# Patient Record
Sex: Female | Born: 1990 | Race: Black or African American | Hispanic: No | Marital: Single | Smoking: Current every day smoker
Health system: Southern US, Community
[De-identification: ages and names within clinical notes are randomized; demographics above are authoritative.]

---

## 2014-06-06 ENCOUNTER — Emergency Department
Admission: EM | Admit: 2014-06-06 | Discharge: 2014-06-06 | Disposition: A | Discharge status: Home / Self Care | Payer: Self-pay | Attending: Emergency Medicine | Admitting: Emergency Medicine

## 2014-06-06 ENCOUNTER — Other Ambulatory Visit: Payer: Self-pay

## 2014-06-06 ENCOUNTER — Emergency Department: Payer: Self-pay

## 2014-06-06 DIAGNOSIS — H1031 Unspecified acute conjunctivitis, right eye: Secondary | ICD-10-CM | POA: Insufficient documentation

## 2014-06-06 MED ORDER — NEOMYCIN-POLYMYXIN-DEXAMETH 3.5-10000-0.1 OP SUSP
OPHTHALMIC | 0 refills | Status: AC
Start: 2014-06-06 — End: ?
  Filled 2014-06-06: qty 5, 10d supply, fill #0

## 2014-06-06 MED ORDER — SULFACETAMIDE SODIUM 10 % OP SOLN
1.0000 [drp] | OPHTHALMIC | Status: AC
Start: 2014-06-06 — End: ?

## 2014-06-06 NOTE — Discharge Instructions (Signed)
Conjunctivitis    You were diagnosed with conjunctivitis. This is also called "pink eye".    Conjunctivitis is an inflammation of the conjunctiva. These are the thin coverings of the white part of the eye and insides of the eyelids. It is caused by many different things. This includes viruses and bacteria. It even includes chemicals. Particles of "junk" that irritate the eye can also be a cause. Viruses are the most common cause.    Symptoms of conjunctivitis include pink eye and redness and drainage. It may feel like there is something in your eye (foreign body sensation). Your lid may get swollen. Your eyelids may also mat or get stuck in the morning.    Conjunctivitis can be very contagious. This means it can easily spread to others. You SHOULD NOT share hygienic items. This includes towels, make-up and tissues. You SHOULD NOT share clothing items. Wash your hands several times a day. Avoid touching your eyes.    Bacterial conjunctivitis is treated with warm compresses. It is also treated with ophthalmic (eye) antibiotics. These antibiotics are topical (not swallowed). Medicine is generally used for 5-7 days.    You SHOULD NOT wear contact lenses while you have conjunctivitis. Wait 48 hours after the infection completely clears up before using them again.    There is no specific follow-up needed. However, if you get any of the problems listed below, you will need a follow-up. If you don t get better as expected, you will also need a follow-up. It is always a good idea to get rechecked by your eye doctor if possible.    YOU SHOULD SEEK MEDICAL ATTENTION IMMEDIATELY, EITHER HERE OR AT THE NEAREST EMERGENCY DEPARTMENT, IF ANY OF THE FOLLOWING OCCURS:   Increasing eye pain.   Vision problems (problems seeing).   Photophobia (light bothering your eyes).   You don t get better after a few days or symptoms get worse at any time.                Mary Rodgers  [id number]  [id number]  [phone]  06/06/2014    Discharge Instructions    As always, you are the most important factor in your recovery.  Please follow these instructions carefully.  If you have problems that we have not discussed, CALL OR VISIT YOUR DOCTOR RIGHT AWAY.     If you can't reach your doctor, return to the emergency department.    I Julieta Bellini understand the written and discussed instructions.  My questions have been answered.  I acknowledge receipt of these instructions.     Patient or responsible person:         Patient's Signature               Physician or Nurse

## 2014-06-06 NOTE — ED Provider Notes (Signed)
[facility] EMERGENCY DEPARTMENT APP H&P         CLINICAL SUMMARY          Diagnosis:    .     Final diagnoses:   Acute conjunctivitis of right eye, unspecified acute conjunctivitis         MDM Notes:      23 y/o F w/ R eye redness, itching and watering  Will tx abx drops for conjunctivitis  Ok for d/c home, ophtho f/u prn      Disposition:      Discharge          Discharge Medication List as of 06/06/2014  8:55 AM      START taking these medications    Details   sulfacetamide (BLEPH-10) 10 % ophthalmic solution Place 1 drop into the right eye every 3 (three) hours., Starting 06/06/2014, Until Discontinued, Print                       CLINICAL INFORMATION        HPI:      Chief Complaint: Eye Drainage  .      Context: home  Location: R eye  Duration: 1 day  Quality: itching  Timing: persistent  Maximum Severity: mild  Modifying Factors: none    Mary Rodgers is a 23 y.o. female  has no past medical history on file. who presents with R eye redness and itching w/ clear drainage for 1 day. Pt denies vision changes, HA, fever, facial pain. C/o URI sx x3 days w/ runny nose, ST and cough. No sick contacts. No other sx or complaints.      History obtained from: Patient      ROS:      Positive and negative ROS elements as per HPI.  All other systems reviewed and negative.      Physical Exam:      Pulse 97  BP 128/78 mmHg  Resp 18  SpO2 100 %  Temp      Physical Exam   Constitutional: She is oriented to person, place, and time. She appears well-developed and well-nourished. No distress.   HENT:   Head: Normocephalic and atraumatic.   Eyes: EOM and lids are normal. Pupils are equal, round, and reactive to light. Right eye exhibits no exudate. Right conjunctiva is injected.   Neck: Normal range of motion. Neck supple.   Cardiovascular: Normal rate, regular rhythm, normal heart sounds and intact distal pulses.    Pulmonary/Chest: Effort normal and breath sounds normal. No respiratory distress.   Abdominal: Soft.  Bowel sounds are normal. There is no tenderness.   Lymphadenopathy:     She has no cervical adenopathy.   Neurological: She is alert and oriented to person, place, and time.   Skin: Skin is warm and dry. She is not diaphoretic.   Nursing note and vitals reviewed.              PAST HISTORY        Primary Care Provider: No primary care provider on file.        PMH/PSH:    .     No past medical history on file.    She has no past surgical history on file.      Social/Family History:      She has no tobacco, alcohol, and drug history on file.    No family history on file.      Listed Medications  on Arrival:    .     Discharge Medication List as of 06/06/2014  8:55 AM         Allergies: She has No Known Allergies.            VISIT INFORMATION        Clinical Course in the ED:      Counseling: I have spoke with the patient/family and discussed today's findings, in addition to providing specific details for the plan of care. Questions are answered and there is agreement with the plan. Return precautions discussed.      Medications Given in the ED:    .     ED Medication Orders     None            Procedures:      Procedures      Interpretations:      Differential Diagnosis (not completely inclusive): High risk differentials considered, and include acute angle glaucoma, globe perforation, conjunctivitis, keratitis, episcleritis, iritis, corneal abrasion    EKG:           Rhythm Strip Interpretation / Cardiac Monitor Analysis:           Pulse Ox Analysis: saturation: 100 %; Oxygen use: room air; Interpretation: Normal      Critical Care Time:     Radiology -  interpreted by me with the following observations:     Splint -                   RESULTS        Lab Results:      Results     ** No results found for the last 24 hours. **              Radiology Results:      No orders to display               Scribe Attestation:                 Honor Junes, PA  06/06/14 1003

## 2014-06-06 NOTE — ED Provider Notes (Signed)
Date Time: 06/06/2014 8:57 AM  Patient Name: Mary Rodgers  Attending Physician: Judi Saa, MD  Attending Note:   The patient was seen and examined by the mid-level (physician's assistant or nurse practitioner), or fellow, and the plan of care was discussed with me. I agree with the plan as it was presented to me.         Judi Saa, MD  06/06/14 [phone]

## 2015-12-26 ENCOUNTER — Emergency Department (HOSPITAL_COMMUNITY): Payer: Medicaid Other

## 2015-12-26 ENCOUNTER — Encounter (HOSPITAL_COMMUNITY): Payer: Self-pay

## 2015-12-26 ENCOUNTER — Emergency Department (HOSPITAL_COMMUNITY)
Admission: EM | Admit: 2015-12-26 | Discharge: 2015-12-26 | Disposition: A | Discharge status: Home / Self Care | Payer: Medicaid Other | Attending: Emergency Medicine | Admitting: Emergency Medicine

## 2015-12-26 DIAGNOSIS — O99331 Smoking (tobacco) complicating pregnancy, first trimester: Secondary | ICD-10-CM | POA: Diagnosis not present

## 2015-12-26 DIAGNOSIS — F17210 Nicotine dependence, cigarettes, uncomplicated: Secondary | ICD-10-CM | POA: Insufficient documentation

## 2015-12-26 DIAGNOSIS — R1084 Generalized abdominal pain: Secondary | ICD-10-CM | POA: Insufficient documentation

## 2015-12-26 DIAGNOSIS — R102 Pelvic and perineal pain: Secondary | ICD-10-CM | POA: Diagnosis not present

## 2015-12-26 DIAGNOSIS — Z3A01 Less than 8 weeks gestation of pregnancy: Secondary | ICD-10-CM | POA: Insufficient documentation

## 2015-12-26 DIAGNOSIS — O9989 Other specified diseases and conditions complicating pregnancy, childbirth and the puerperium: Secondary | ICD-10-CM | POA: Insufficient documentation

## 2015-12-26 LAB — COMPREHENSIVE METABOLIC PANEL
ALK PHOS: 50 U/L (ref 38–126)
ALT: 12 U/L — ABNORMAL LOW (ref 14–54)
ANION GAP: 5 (ref 5–15)
AST: 16 U/L (ref 15–41)
Albumin: 3.4 g/dL — ABNORMAL LOW (ref 3.5–5.0)
BILIRUBIN TOTAL: 0.4 mg/dL (ref 0.3–1.2)
BUN: 7 mg/dL (ref 6–20)
CALCIUM: 8.9 mg/dL (ref 8.9–10.3)
CO2: 24 mmol/L (ref 22–32)
CREATININE: 0.53 mg/dL (ref 0.44–1.00)
Chloride: 108 mmol/L (ref 101–111)
Glucose, Bld: 91 mg/dL (ref 65–99)
Potassium: 3.5 mmol/L (ref 3.5–5.1)
SODIUM: 137 mmol/L (ref 135–145)
TOTAL PROTEIN: 6.7 g/dL (ref 6.5–8.1)

## 2015-12-26 LAB — URINALYSIS, ROUTINE W REFLEX MICROSCOPIC
Bilirubin Urine: NEGATIVE
Glucose, UA: NEGATIVE mg/dL
Hgb urine dipstick: NEGATIVE
Ketones, ur: NEGATIVE mg/dL
Leukocytes, UA: NEGATIVE
NITRITE: NEGATIVE
PROTEIN: NEGATIVE mg/dL
SPECIFIC GRAVITY, URINE: 1.026 (ref 1.005–1.030)
pH: 6.5 (ref 5.0–8.0)

## 2015-12-26 LAB — HCG, QUANTITATIVE, PREGNANCY: hCG, Beta Chain, Quant, S: 44011 m[IU]/mL — ABNORMAL HIGH (ref <5)

## 2015-12-26 LAB — I-STAT BETA HCG BLOOD, ED (MC, WL, AP ONLY)

## 2015-12-26 LAB — CBC
HCT: 33.9 % — ABNORMAL LOW (ref 36.0–46.0)
HEMOGLOBIN: 11.6 g/dL — AB (ref 12.0–15.0)
MCH: 31.1 pg (ref 26.0–34.0)
MCHC: 34.2 g/dL (ref 30.0–36.0)
MCV: 90.9 fL (ref 78.0–100.0)
PLATELETS: 285 10*3/uL (ref 150–400)
RBC: 3.73 MIL/uL — AB (ref 3.87–5.11)
RDW: 11.5 % (ref 11.5–15.5)
WBC: 6.9 10*3/uL (ref 4.0–10.5)

## 2015-12-26 LAB — LIPASE, BLOOD: LIPASE: 19 U/L (ref 11–51)

## 2015-12-26 MED ORDER — SODIUM CHLORIDE 0.9 % IV BOLUS (SEPSIS)
1000.0000 mL | Freq: Once | INTRAVENOUS | Status: DC
Start: 2015-12-26 — End: 2015-12-26

## 2015-12-26 NOTE — Discharge Instructions (Signed)
Continue taking prenatal vitamins.   Quit smoking.   [facility] for [facility] follow up as scheduled.   Return to ER if you have worse abdominal cramps, vaginal bleeding, fevers, vomiting.

## 2015-12-26 NOTE — ED Notes (Signed)
Pt reports [facility] for abd pain and was told she was 3-[redacted] weeks pregnant.  Pain has not improved.  [facility] [place] and made appt with [facility] for 02-01-16 for 1st appt.  G2P0.  Abd pain is mid lower abd.  No bleeding or urinary symptoms.

## 2015-12-26 NOTE — ED Provider Notes (Signed)
CSN: [id number]     Arrival date & time 12/26/15  1423 History   First MD [id number] Contact with Patient 12/26/15 1500     Chief Complaint  Patient presents with  . Abdominal Pain     (Consider location/radiation/quality/duration/timing/severity/associated sxs/prior Treatment) The history is provided by the patient.  Margaret Jacobs is a 25 y.o. female G2P0 here with lower abdominal pain. Lower abdominal pain for the last several weeks. [facility] and was seen in the ED on 5/10 and had positive pregnancy test. There was no ultrasound done at that time. [facility] but has persistent lower abdominal cramps. Denies any vaginal bleeding or discharge or any vomiting. Denies any Fevers or chills or urinary symptoms. She had 1 miscarriage previously.     History reviewed. No pertinent past medical history. History reviewed. No pertinent past surgical history. History reviewed. No pertinent family history. Social History  Substance Use Topics  . Smoking status: Current Every Day Smoker -- 0.50 packs/day    Types: Cigarettes  . Smokeless tobacco: None  . Alcohol Use: Yes   OB History    No data available     Review of Systems  Gastrointestinal: Positive for abdominal pain.  All other systems reviewed and are negative.     Allergies  Review of patient's allergies indicates no known allergies.  Home Medications   Prior to Admission medications   Not on File   BP 122/75 mmHg  Pulse 80  Temp(Src) 98.8 F (37.1 C) (Oral)  Resp 16  Ht 5\' 3"  (1.6 m)  Wt 196 lb (88.905 kg)  BMI 34.73 kg/m2  SpO2 100%  LMP  (LMP Unknown) Physical Exam  Constitutional: She is oriented to person, place, and time. She appears well-developed and well-nourished.  HENT:  Head: Normocephalic.  Mouth/Throat: Oropharynx is clear and moist.  Eyes: Conjunctivae are normal. Pupils are equal, round, and reactive to light.  Neck: Normal  range of motion. Neck supple.  Cardiovascular: Normal rate, regular rhythm and normal heart sounds.   Pulmonary/Chest: Effort normal and breath sounds normal. No respiratory distress. She has no wheezes. She has no rales.  Abdominal: Soft. Bowel sounds are normal.  + mild diffuse lower abdominal tenderness, no obvious adnexal tenderness, no CVAT   Musculoskeletal: Normal range of motion. She exhibits no edema or tenderness.  Neurological: She is alert and oriented to person, place, and time.  Skin: Skin is warm and dry.  Psychiatric: She has a normal mood and affect. Her behavior is normal. Judgment and thought content normal.  Nursing note and vitals reviewed.   ED Course  Procedures (including critical care time) Labs Review Labs Reviewed  COMPREHENSIVE METABOLIC PANEL - Abnormal; Notable for the following:    Albumin 3.4 (*)    ALT 12 (*)    All other components within normal limits  CBC - Abnormal; Notable for the following:    RBC 3.73 (*)    Hemoglobin 11.6 (*)    HCT 33.9 (*)    All other components within normal limits  HCG, QUANTITATIVE, PREGNANCY - Abnormal; Notable for the following:    hCG, Beta Chain, Quant, S 44011 (*)    All other components within normal limits  I-STAT BETA HCG BLOOD, ED ([facility], [facility], [facility] ONLY) - Abnormal; Notable for the following:    I-stat hCG, quantitative >2000.0 (*)    All other components within normal limits  LIPASE, BLOOD  URINALYSIS,  ROUTINE W REFLEX MICROSCOPIC (NOT AT [facility])    Imaging Review US Ob Comp Less 14 Wks  12/26/2015  CLINICAL DATA:  Abdominal pain. Evaluate for ectopic. Beta HCG greater than 2000. EXAM: OBSTETRIC <14 WK [place] AND TRANSVAGINAL OB US TECHNIQUE: Both transabdominal and transvaginal ultrasound examinations were performed for complete evaluation of the gestation as well as the maternal uterus, adnexal regions, and pelvic cul-de-sac. Transvaginal technique was performed to assess early pregnancy. COMPARISON:  None.  FINDINGS: Intrauterine gestational sac: Present Yolk sac:  Present Embryo:  Present Cardiac Activity: Present Heart Rate: 121  bpm MSD:   mm    w     d CRL:  7  mm   6 w   4 d                  [place] EDC: 08/16/2016 Subchorionic hemorrhage:  None demonstrated. Maternal uterus/adnexae: Hypoechoic area in the left ovary measuring 1.8 x 1.4 cm, probably corpus luteum. No mass or free fluid within the adjacent left adnexal region. Right ovary appears normal and there is no mass or free fluid demonstrated in the right adnexal region. No mass or free fluid seen within the cul-de-sac. IMPRESSION: 1. Single live intrauterine pregnancy with estimated gestational age of [redacted] weeks and 4 days. Fetal heart rate measured at 121 beats per minute. 2. Both maternal ovaries appear normal and there is no mass or free fluid seen within either adnexal region. Electronically Signed   By: Bary Richard M.D.   On: 12/26/2015 16:39   US Ob Transvaginal  12/26/2015  CLINICAL DATA:  Abdominal pain. Evaluate for ectopic. Beta HCG greater than 2000. EXAM: OBSTETRIC <14 WK [place] AND TRANSVAGINAL OB US TECHNIQUE: Both transabdominal and transvaginal ultrasound examinations were performed for complete evaluation of the gestation as well as the maternal uterus, adnexal regions, and pelvic cul-de-sac. Transvaginal technique was performed to assess early pregnancy. COMPARISON:  None. FINDINGS: Intrauterine gestational sac: Present Yolk sac:  Present Embryo:  Present Cardiac Activity: Present Heart Rate: 121  bpm MSD:   mm    w     d CRL:  7  mm   6 w   4 d                  [place] EDC: 08/16/2016 Subchorionic hemorrhage:  None demonstrated. Maternal uterus/adnexae: Hypoechoic area in the left ovary measuring 1.8 x 1.4 cm, probably corpus luteum. No mass or free fluid within the adjacent left adnexal region. Right ovary appears normal and there is no mass or free fluid demonstrated in the right adnexal region. No mass or free fluid seen within the cul-de-sac.  IMPRESSION: 1. Single live intrauterine pregnancy with estimated gestational age of [redacted] weeks and 4 days. Fetal heart rate measured at 121 beats per minute. 2. Both maternal ovaries appear normal and there is no mass or free fluid seen within either adnexal region. Electronically Signed   By: Bary Richard M.D.   On: 12/26/2015 16:39   I have personally reviewed and evaluated these images and lab results as part of my medical decision-making.   EKG Interpretation None      MDM   Final diagnoses:  Pelvic pain in pregnant patient at less than [redacted] weeks gestation    Brazil Voytko is a 25 y.o. female here with lower abdominal pain. About [redacted] weeks pregnant by dates. No vaginal discharge or vaginal bleeding. Haven't had documented IUP yet. Will get HCG level, transvag US.  5:11 PM UA nl. HCG 44011. US showed live IUP 6 weeks 4 days with FHR 121. FHR slightly lower than I expect but still within normal limits. She is already on prenatal vitamins and is trying to quit smoking. She [facility]. Return if she has severe cramps, vaginal bleeding.    Richardean Canal, MD 12/26/15 [phone]

## 2015-12-26 NOTE — ED Notes (Signed)
Pt transported to US

## 2016-01-15 ENCOUNTER — Emergency Department (HOSPITAL_COMMUNITY)
Admission: EM | Admit: 2016-01-15 | Discharge: 2016-01-15 | Disposition: A | Discharge status: Home / Self Care | Payer: Medicaid Other | Attending: Emergency Medicine | Admitting: Emergency Medicine

## 2016-01-15 ENCOUNTER — Encounter (HOSPITAL_COMMUNITY): Payer: Self-pay

## 2016-01-15 DIAGNOSIS — H9202 Otalgia, left ear: Secondary | ICD-10-CM

## 2016-01-15 DIAGNOSIS — O26891 Other specified pregnancy related conditions, first trimester: Secondary | ICD-10-CM | POA: Insufficient documentation

## 2016-01-15 DIAGNOSIS — F17210 Nicotine dependence, cigarettes, uncomplicated: Secondary | ICD-10-CM | POA: Diagnosis not present

## 2016-01-15 DIAGNOSIS — Z3A08 8 weeks gestation of pregnancy: Secondary | ICD-10-CM | POA: Diagnosis not present

## 2016-01-15 DIAGNOSIS — B9789 Other viral agents as the cause of diseases classified elsewhere: Secondary | ICD-10-CM

## 2016-01-15 DIAGNOSIS — J029 Acute pharyngitis, unspecified: Secondary | ICD-10-CM | POA: Diagnosis not present

## 2016-01-15 DIAGNOSIS — J028 Acute pharyngitis due to other specified organisms: Secondary | ICD-10-CM

## 2016-01-15 LAB — RAPID STREP SCREEN (MED CTR MEBANE ONLY): Streptococcus, Group A Screen (Direct): NEGATIVE

## 2016-01-15 NOTE — ED Notes (Signed)
Pt. Reports having lt. Ear ache with pain into her lt. Throat area.   She also reports having chills and feeling hot.   She is [redacted] weeks pregnant and would like the FHT done.  She denies any vaginal bleeding or cramping.

## 2016-01-15 NOTE — Discharge Instructions (Signed)
Your strep screen today is negative and your exam shows not ear infection. Take tylenol for pain or fever, use Chloraseptic Spray and use salt water gargles. Follow up with your doctor [facility] problems with your [facility].

## 2016-01-15 NOTE — ED Provider Notes (Signed)
CSN: [id number]     Arrival date & time 01/15/16  1804 History  By signing my name below, I, Margaret Jacobs, attest that this documentation has been prepared under the direction and in the presence of [facility]. Damian LeavellNeese, NP.  Electronically Signed: Gillis EndsJasmyn B. Lyn HollingsheadAlexander, ED Scribe. 01/15/2016. 6:59 PM.    Chief Complaint  Patient presents with  . Otalgia    Patient is a 25 y.o. female presenting with ear pain. The history is provided by the patient. No language interpreter was used.  Otalgia Location:  Left Quality:  Aching Severity:  Moderate Onset quality:  Gradual Duration:  18 hours Timing:  Constant Associated symptoms: fever and sore throat   Associated symptoms: no congestion     HPI Comments: Margaret Jacobs is a 25 y.o. female who presents to the Emergency Department complaining of gradual onset, constant, aching left ear pain x 1 day. Pt has associated throat pain, but no difficulty  Swallowing. She denies cough or congestion.  She does report fever and chills. She does not have any history of ear problems. Denies any sick contact. There are no modifying factors.   Pt is also about [redacted] weeks pregnant and requests an ultrasound to check fetus heart rate. She was here 2 weeks ago and had an ultrasound that showed a 6 week viable IUP. She was [facility] to [facility] for heavy bleeding or cramping.   History reviewed. No pertinent past medical history. History reviewed. No pertinent past surgical history. No family history on file. Social History  Substance Use Topics  . Smoking status: Current Every Day Smoker -- 0.50 packs/day    Types: Cigarettes  . Smokeless tobacco: None  . Alcohol Use: Yes   OB History    No data available     Review of Systems  Constitutional: Positive for fever and chills.  HENT: Positive for ear pain, sore throat and trouble swallowing. Negative for congestion.   all other systems negative   Allergies  Review of patient's allergies indicates  no known allergies.  Home Medications   Prior to Admission medications   Not on File   BP 103/63 mmHg  Pulse 73  Temp(Src) 98.7 F (37.1 C) (Oral)  Resp 16  Ht 5\' 2"  (1.575 m)  Wt 88.905 kg  BMI 35.84 kg/m2  SpO2 100%  LMP  (LMP Unknown) Physical Exam  Constitutional: She is oriented to person, place, and time. She appears well-developed and well-nourished.  HENT:  Head: Normocephalic and atraumatic.  Right Ear: Tympanic membrane normal.  Left Ear: Tympanic membrane normal.  Nose: Nose normal.  Mouth/Throat: Uvula is midline and mucous membranes are normal. Posterior oropharyngeal erythema present. No posterior oropharyngeal edema.  No TMJ tenderness or click.  Eyes: Conjunctivae and EOM are normal. Pupils are equal, round, and reactive to light.  Neck: Normal range of motion. Neck supple.  Cardiovascular: Normal rate and regular rhythm.   Pulmonary/Chest: Effort normal. No respiratory distress. She has no wheezes. She has no rales.  Abdominal: Soft. Bowel sounds are normal. There is no tenderness.  Musculoskeletal: Normal range of motion.  Lymphadenopathy:    She has no cervical adenopathy.  Neurological: She is alert and oriented to person, place, and time.  Skin: Skin is warm and dry. No rash noted.  Psychiatric: She has a normal mood and affect.  Nursing note and vitals reviewed.   ED Course  Procedures (including critical care time) DIAGNOSTIC STUDIES: Oxygen Saturation is 100% on RA, normal  by my interpretation.    COORDINATION OF CARE: 6:36 PM-Discussed treatment plan which includes strep screen with pt at bedside and pt agreed to plan.   Labs Review Labs Reviewed  RAPID STREP SCREEN (NOT AT [facility])  CULTURE, GROUP A STREP [facility])     MDM  25 y.o. female with sore throat and ear pain stable for d/c without fever, she has a negative strep screen and normal TM's, she does not appear toxic. Will treat for viral illness. Discussed taking tylenol for fever and  pain. Discouraged use of OTC medications during early pregnancy without discussion with her OB first. She will f/u with her doctor or go to [facility] for any problems.   Final diagnoses:  Sore throat (viral)  Ear pain, left   I personally performed the services described in this documentation, which was scribed in my presence. The recorded information has been reviewed and is accurate.   [place], [place] 01/15/16 2222  Vanetta Mulders, MD 01/22/16 [phone]

## 2016-01-18 LAB — CULTURE, GROUP A STREP (THRC)

## 2016-02-01 ENCOUNTER — Encounter: Payer: Self-pay | Admitting: Obstetrics

## 2018-01-02 IMAGING — US US OB TRANSVAGINAL
1 series · 13 of 28 positions shown · non-contrast
Comparison: None.

CLINICAL DATA: Abdominal pain. Evaluate for ectopic. Beta HCG
greater than 7555.

EXAM:
OBSTETRIC <14 WK US AND TRANSVAGINAL OB US
TECHNIQUE: Both transabdominal and transvaginal ultrasound examinations were
performed for complete evaluation of the gestation as well as the
maternal uterus, adnexal regions, and pelvic cul-de-sac.
Transvaginal technique was performed to assess early pregnancy.

[Series 1: us ob transvaginal · 0.19mm/px · 13 of 68 slices shown]
[im 3/68]
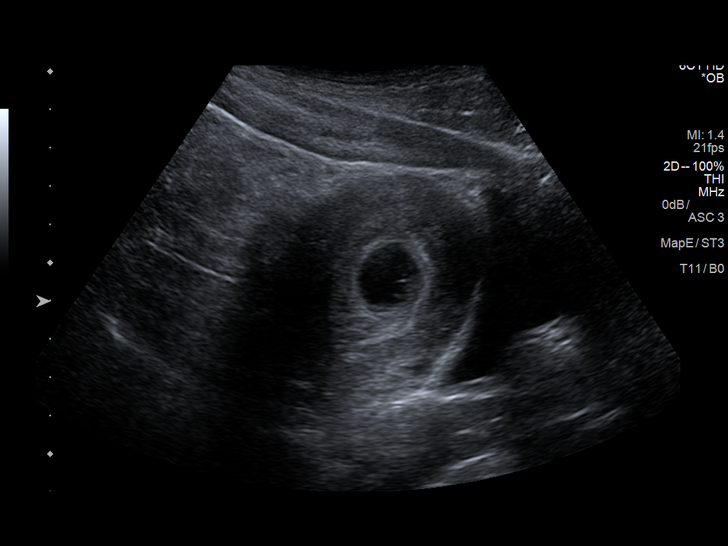
[im 8/68]
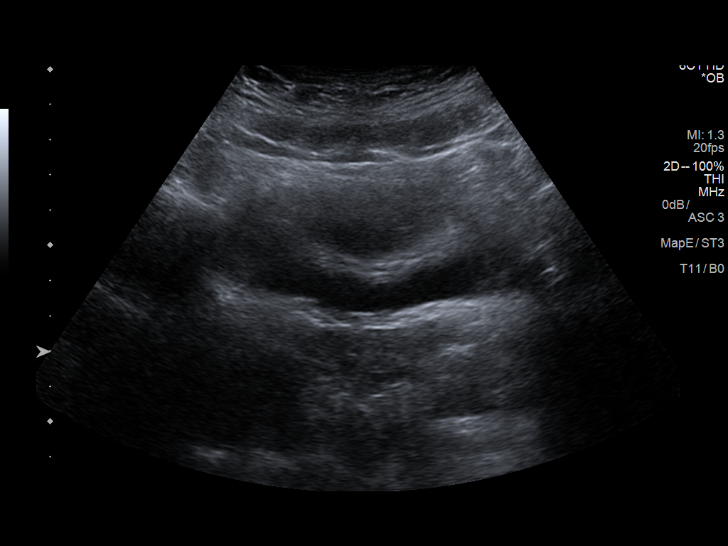
[im 13/68]
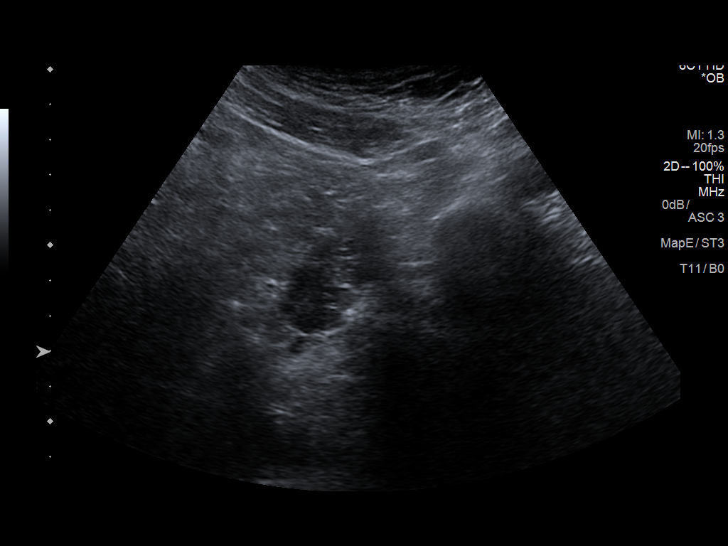
[im 18/68]
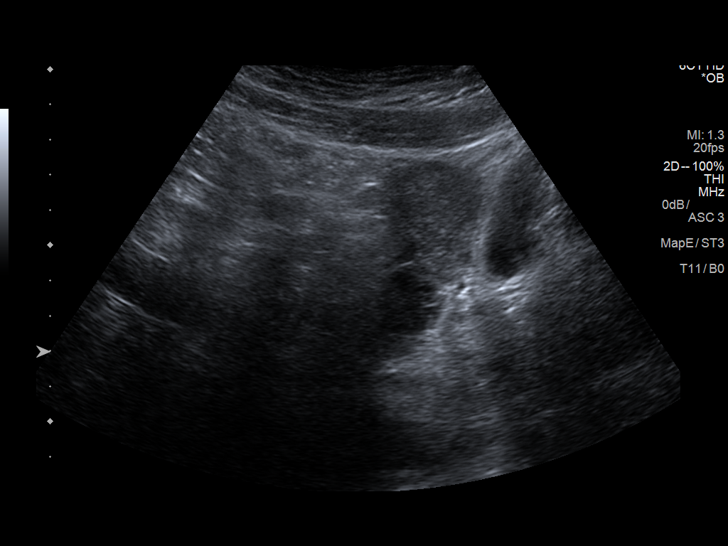
[im 23/68]
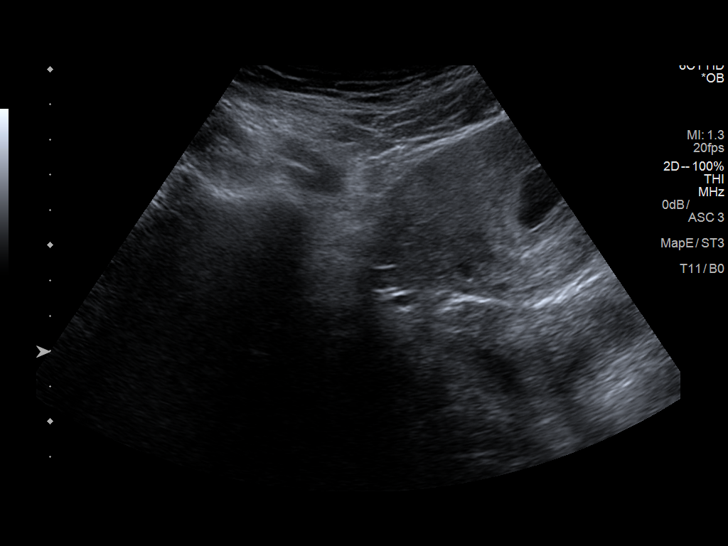
[im 28/68]
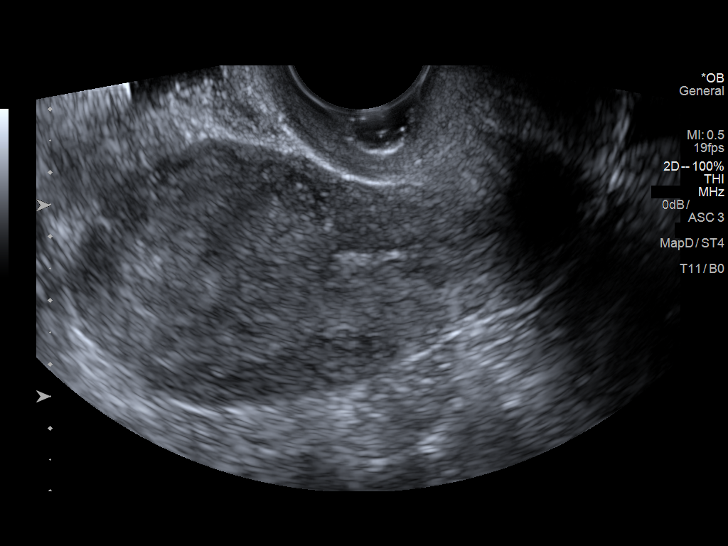
[im 35/68]
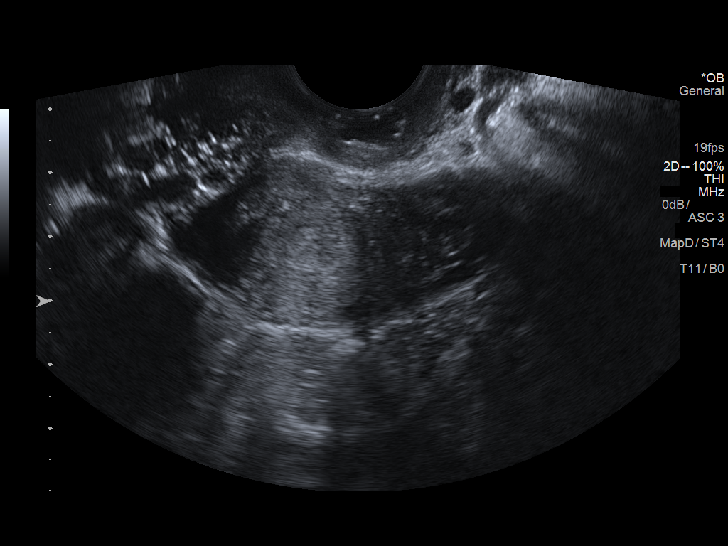
[im 40/68]
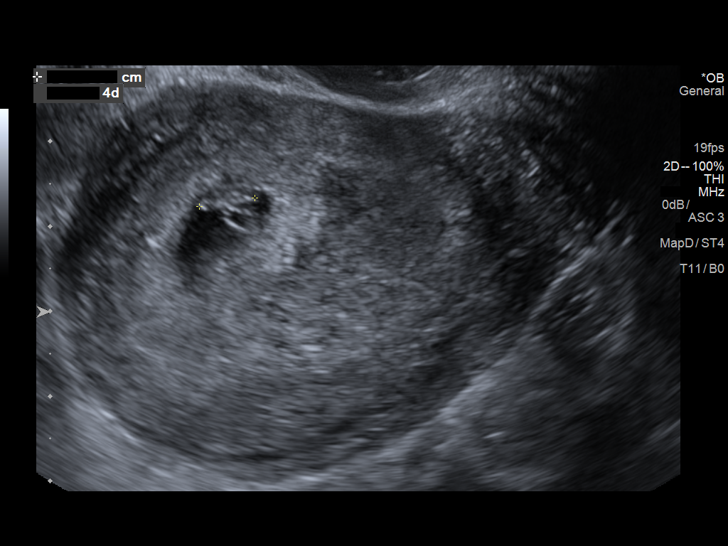
[im 45/68]
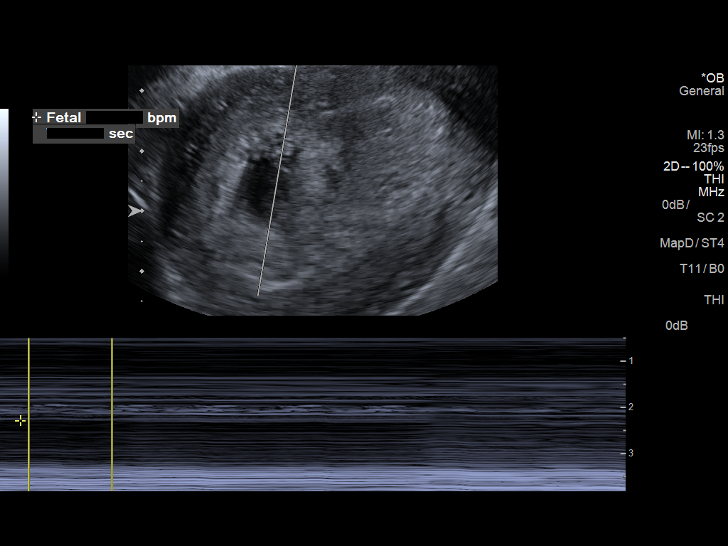
[im 50/68]
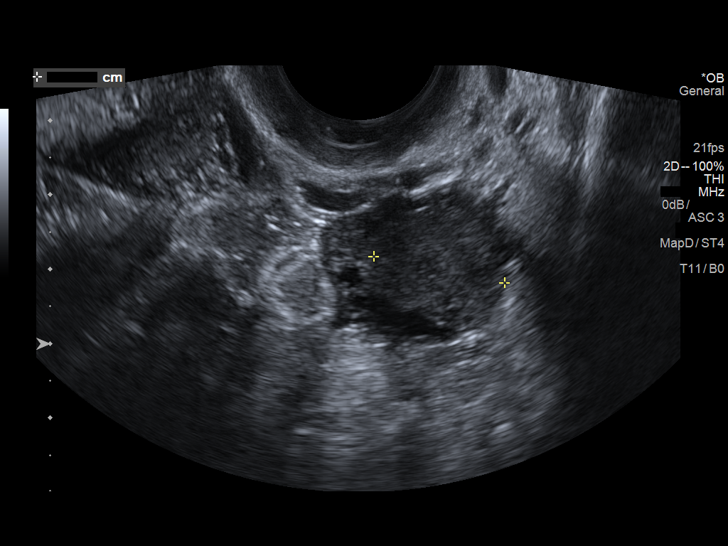
[im 55/68]
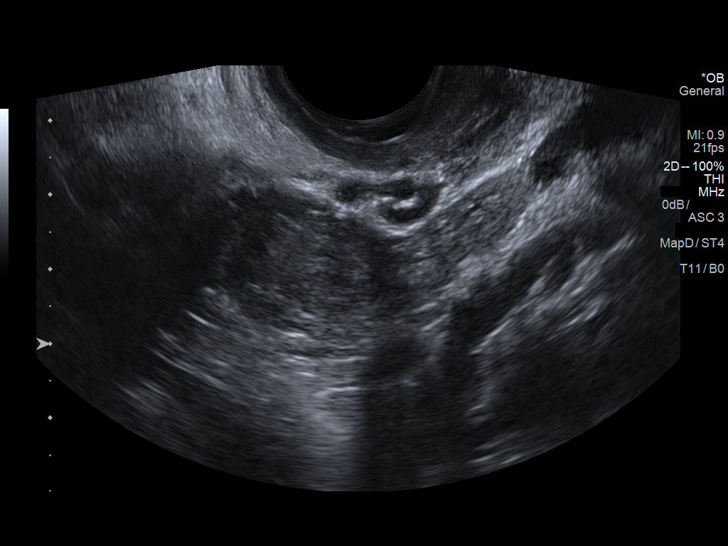
[im 60/68]
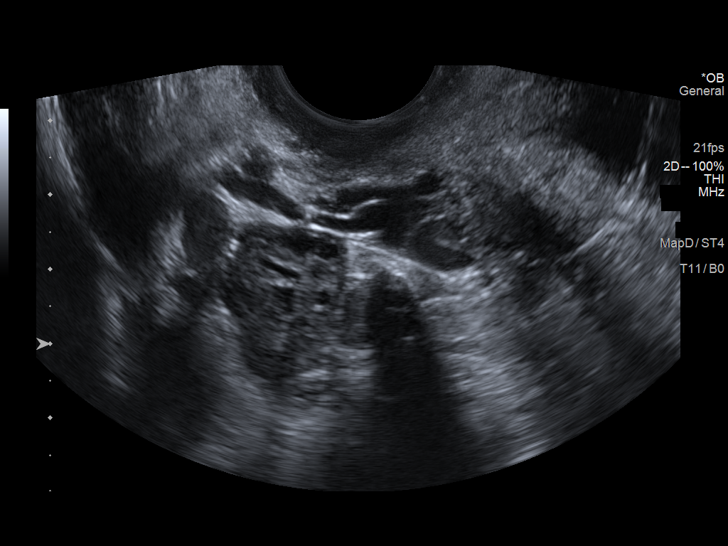
[im 65/68]
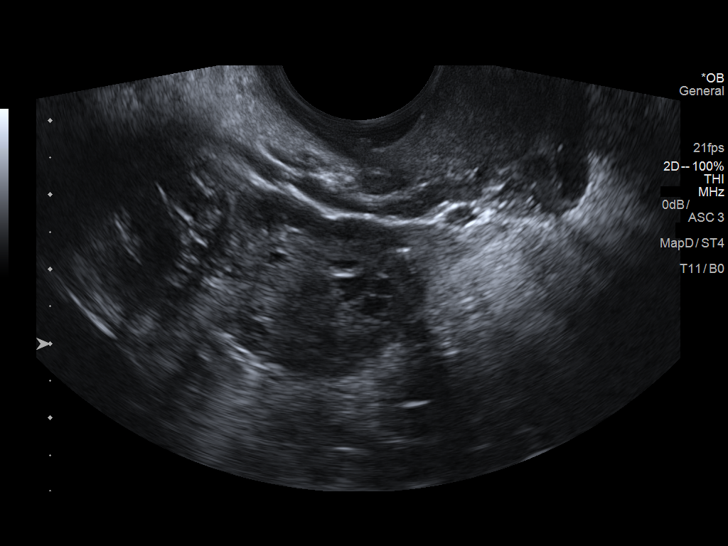

[13 of 28 positions shown; findings below may reference images not displayed]

FINDINGS: Intrauterine gestational sac: Present

Yolk sac:  Present

Embryo:  Present

Cardiac Activity: Present

Heart Rate: 121  bpm

MSD:   mm    w     d

CRL:  7  mm   6 w   4 d                  US EDC: 08/16/2016

Subchorionic hemorrhage:  None demonstrated.

Maternal uterus/adnexae: Hypoechoic area in the left ovary measuring
1.8 x 1.4 cm, probably corpus luteum. No mass or free fluid within
the adjacent left adnexal region. Right ovary appears normal and
there is no mass or free fluid demonstrated in the right adnexal
region. No mass or free fluid seen within the cul-de-sac.
IMPRESSION: 1. Single live intrauterine pregnancy with estimated gestational age
of 6 weeks and 4 days. Fetal heart rate measured at 121 beats per
minute.
2. Both maternal ovaries appear normal and there is no mass or free
fluid seen within either adnexal region.
